# Patient Record
Sex: Female | Born: 1945 | Race: White | Hispanic: No | Marital: Single
Health system: Midwestern US, Academic
[De-identification: ages and names within clinical notes are randomized; demographics above are authoritative.]

---

## 2017-03-22 ENCOUNTER — Encounter: Admit: 2017-03-22 | Discharge: 2017-03-22 | Payer: MEDICARE

## 2017-03-22 ENCOUNTER — Encounter: Admit: 2017-03-22 | Discharge: 2017-03-23 | Payer: MEDICARE

## 2017-03-22 DIAGNOSIS — R69 Illness, unspecified: Principal | ICD-10-CM

## 2017-03-24 ENCOUNTER — Encounter: Admit: 2017-03-24 | Discharge: 2017-03-25 | Payer: MEDICARE

## 2017-03-24 DIAGNOSIS — R69 Illness, unspecified: Principal | ICD-10-CM

## 2017-05-09 ENCOUNTER — Encounter: Admit: 2017-05-09 | Discharge: 2017-05-09 | Payer: MEDICARE

## 2017-05-09 NOTE — Telephone Encounter
Navigation Intake Assessment    Patient Name:  Kathryn Holden  DOB: [birth year]  Insurance:  Medicare   Direct Referral: Dr. Sibyl Parr   Appointment Info:  Dr. Lezlie Lye 05/12/17 1000    Diagnosis & Reason for Visit:  Ovarian mass      Physician Info:  ? Referring Physician:  Jonah Blue   ??? Contact Name & Number:  [phone] F [phone]  ??? 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 Patient cannot lie flat d/t flexion contracture at the hips and knees. 03/22/17 sono d/t recent onset of urinary incontinence reports 8.4 x 6.2 x 6.4 cm cystic mass within the right adnexa. Patient with 03/24/17 CT demonstrates a right adnexal mass 8.1 x 5.6cm. 03/30/17 went the ED for blood in stool. Started PPI. 03/31/17 PCP referred on to general surgeon. Patient notes she has become more SOB with transfers recently. Plan obtain stress test/ekg and CBC in a week. 04/28/17 back to general surgeon. Hbg normal. EF 68%, stress test showed some mild reversible perfusion abnormality in basal anteroseptal and mid anteroseptal segments. Although it was a difficult study, patient is deconditioned and sits in WC. General surgery recommends Gyn Onc consult at this time.     Sister is patients DPOA and caregiver. All hx was provided below per sister.       PMH: BMI - sister verbalizes patient is 90lbs and transfers easily with 2, CVA, TIA, dystonia -d/t polio, BCC -right eye    Surgical Hx   Surgery/Year: ulcer,     Reproductive History  Menstrual Hx   LMP: late 7   Having Periods:  No   Age at first period: 30    Pregnancy Hx   Number of pregnancies: 0   Number of live births: 0   Age of first live birth:    Did you breastfeed: No    If Yes, how long?    Oral Birth Control:  No   Years:    Infertility Medication:  No   Year/Med Name:     Menopausal Hx   Age of last period:    Hormone Replacement Therapy:  No Years:         Health Maintenence  Last Pap: ~15years  Abn History of Pap: denies    Colonoscopy: 2017    Mammogram: 2008    Bone scan: denies    DPOA: denies    Living Will: denies       NEEDS Assessment:     Genetic Counseling:  Assessment:  Genetic Assessment: Not assessed at this time      Intervention:        Nutrition:  Assessment:  Current Weight (in pounds): 90  Additional Nutrition Assessment: Not assessed at this time    Intervention:       Social & Financial:  Assessment:  Social and Financial Assessment: Lodging;Transportation    Intervention:          Spiritual & Emotional:  Assessment:   Spiritual and Emotional Assessment: Reports adequate support system    Intervention:       Physical:  Assessment:  Fall Risk: Use of assistive device  Physical needs: Use of assistive device  Assistive Devices: Wheelchair    Intervention:       Communication:  Assessment:  Communication Barrier: Yes  Intervention:       Onc Fertility:   Assessment:  Onc Fertility Assessment: Not applicable    Intervention:       Additional Education:

## 2017-05-11 ENCOUNTER — Encounter: Admit: 2017-05-11 | Discharge: 2017-05-11 | Payer: MEDICARE

## 2017-05-11 DIAGNOSIS — R69 Illness, unspecified: Principal | ICD-10-CM

## 2017-05-12 ENCOUNTER — Encounter: Admit: 2017-05-12 | Discharge: 2017-05-12 | Payer: MEDICARE

## 2017-05-12 DIAGNOSIS — R1900 Intra-abdominal and pelvic swelling, mass and lump, unspecified site: Principal | ICD-10-CM

## 2017-05-12 DIAGNOSIS — Z993 Dependence on wheelchair: ICD-10-CM

## 2017-05-12 DIAGNOSIS — G893 Neoplasm related pain (acute) (chronic): ICD-10-CM

## 2017-05-12 DIAGNOSIS — M1990 Unspecified osteoarthritis, unspecified site: ICD-10-CM

## 2017-05-12 DIAGNOSIS — I10 Essential (primary) hypertension: ICD-10-CM

## 2017-05-12 DIAGNOSIS — Z8673 Personal history of transient ischemic attack (TIA), and cerebral infarction without residual deficits: ICD-10-CM

## 2017-05-12 DIAGNOSIS — M810 Age-related osteoporosis without current pathological fracture: ICD-10-CM

## 2017-05-12 DIAGNOSIS — G8220 Paraplegia, unspecified: ICD-10-CM

## 2017-05-12 DIAGNOSIS — I639 Cerebral infarction, unspecified: ICD-10-CM

## 2017-05-12 DIAGNOSIS — C4490 Unspecified malignant neoplasm of skin, unspecified: Principal | ICD-10-CM

## 2017-05-12 DIAGNOSIS — Z85828 Personal history of other malignant neoplasm of skin: ICD-10-CM

## 2017-05-12 DIAGNOSIS — IMO0002 Ulcer: ICD-10-CM

## 2017-05-12 DIAGNOSIS — N838 Other noninflammatory disorders of ovary, fallopian tube and broad ligament: ICD-10-CM

## 2017-05-12 DIAGNOSIS — A809 Acute poliomyelitis, unspecified: ICD-10-CM

## 2017-05-12 LAB — CA125: Lab: 24 U/mL — ABNORMAL HIGH (ref <35)

## 2017-05-12 NOTE — Progress Notes
Subjective     GYNECOLOGIC ONCOLOGY EVALUATION    Name:Kathryn Holden    Date: 05/12/17    Referring Physician: Jonah Blue     Primary Care Physician: Dr. Steva Ready and [facility], [place]    Chief Complaint: Pelvic mass (accompanied by her sister Kathryn Holden - DPOA and her niece)  Chief Complaint   Patient presents with   ??? Heme/Onc Care       History of Present Illness: Ms. Kathryn Holden is a 71 y.o. female with the following history. The patient has multiple severe underlying medical problems including, a history of CVA and TIAs and paraplegia from polio as a child. Received live polio vaccine. Is wheelchair bound with dystonia and contractures (severe flexures at hips and knees). Has had multiple musculoskeletal, nerve and spinal surgeries. Lives in group home. Has a job Advertising account planner. Sister is her DPOA.  Has had difficulty with increasing urinary incontinence. Workup included an abdominal ultrasound and CT.  -03/22/17:  abdominal ultrasound sono demonstrated an 8.4 x 6.2 x 6.4 cm cystic mass within the right adnexa.   -03/24/17: CT demonstrated a right adnexal mass 8.1 x 5.6cm.   -03/30/17: went the ED for blood in stool. Started on a  PPI.   -03/31/17: PCP referred on to a general surgeon. Patient noted to have become more SOB with transfers. Requires more help at group home.   -04/28/17: Follow up with general surgeon. Hbg normal. EF 68%, stress test showed some mild reversible perfusion abnormality in basal anteroseptal and mid anteroseptal segments (difficult study). Referred to Gyn Onc.      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 The patient reports increased fatigue. Has a mild URI (runny nose, cough and occasional wheezing). Denies abdominal/pelvic pain. However, her sister states that she does normally feel pain because of nerve damage. She denies fever/chills, change in bowel or bladder habits (baseline constipation and urinary incontinence), and has blood in stool. No vaginal bleeding or discharge.     OB/GYN History:  Menstrual Hx               LMP: late 63               Having Periods:  No               Age at first period: 75  ???  Pregnancy Hx               Number of pregnancies: 0               Number of live births: 0               Age of first live birth:                Did you breastfeed: No                            If Yes, how long?                Oral Birth Control:  No   Years:                Infertility Medication:  No               Year/Med Name:   ???  Menopausal Hx  Age of last period:                Hormone Replacement Therapy:  No               Years:   ???  Health Maintenance:  Last Pap: ~15years  Abn History of Pap: denies  Colonoscopy: 2017  Mammogram: 2008    Past Medical History:  Past Medical History:   Diagnosis Date   ??? Arthritis    ??? Cancer of skin     under right eye   ??? Hypertension    ??? Osteoporosis    ??? Polio    ??? Stroke [facility])    ??? Ulcer        Past Surgical History:  Past Surgical History:   Procedure Laterality Date   ??? HX SKIN RESECTION Right 2012    basal cell carcinoma form right periorbital region   ??? SURGERY  2017    ulcer ablation   ??? NERVE SURGERY      nervectomy    ??? OTHER SURGICAL HISTORY      Musculoskeletal surgery       Medications:    Current Outpatient Medications:   ???  acetaminophen (TYLENOL) 325 mg tablet, Take 325 mg by mouth every 4 hours as needed for Pain., Disp: , Rfl:   ???  albuterol 0.083% (PROVENTIL; VENTOLIN) 2.5 mg /3 mL (0.083 %) nebulizer solution, USE 1 VIAL IN NEBULIZER 3 TIIMES DAILY AS NEEDED FOR SHORTNESS OF BREATH/WHEEZING, Disp: , Rfl: 5  ???  ascorbic acid (VITAMIN C) 500 mg tablet, Take 500 mg by mouth daily., Disp: , Rfl:   ???  cetirizine (ZYRTEC) 10 mg tablet, Take 10 mg by mouth at bedtime daily., Disp: , Rfl: 2 ???  cholecalciferol (VITAMIN D-3) 1,000 units tablet, Take 1,000 Units by mouth daily., Disp: , Rfl:   ???  diazePAM (VALIUM) 5 mg tablet, Take 5 mg by mouth three times daily., Disp: , Rfl: 2  ???  docusate (COLACE) 100 mg capsule, Take 100 mg by mouth twice daily., Disp: , Rfl:   ???  ferrous sulfate (FEOSOL, FEROSUL) 325 mg (65 mg iron) tablet, Take 325 mg by mouth twice daily., Disp: , Rfl: 3  ???  meloxicam (MOBIC) 7.5 mg tablet, Take 7.5 mg by mouth twice daily., Disp: , Rfl: 6  ???  metoprolol XL (TOPROL XL) 25 mg extended release tablet, TAKE 1 TABLET BY MOUTH DAILY IN THE MORNING AND 1/2 TABLET IN THE EVENING, Disp: , Rfl: 3  ???  MULTIVITAMIN PO, Take  by mouth., Disp: , Rfl:   ???  MYRBETRIQ 50 mg tablet, Take 50 mg by mouth daily., Disp: , Rfl: 5  ???  pantoprazole DR (PROTONIX) 40 mg tablet, Take 40 mg by mouth daily., Disp: , Rfl: 2    Allergies:  Allergies   Allergen Reactions   ??? Codeine UNKNOWN   ??? Penicillins UNKNOWN   ??? Sulfa (Sulfonamide Antibiotics) UNKNOWN   ??? Tegretol [Carbamazepine] UNKNOWN       Social History:  Social History     Socioeconomic History   ??? Marital status: Single     Spouse name: Not on file   ??? Number of children: Not on file   ??? Years of education: Not on file   ??? Highest education level: Not on file   Social Needs   ??? Financial resource strain: Not on file   ??? Food insecurity - worry: Not on file   ???  Food insecurity - inability: Not on file   ??? Transportation needs - medical: Not on file   ??? Transportation needs - non-medical: Not on file   Occupational History   ??? Not on file   Tobacco Use   ??? Smoking status: Never Smoker   ??? Smokeless tobacco: Never Used   Substance and Sexual Activity   ??? Alcohol use: No     Frequency: Never   ??? Drug use: No   ??? Sexual activity: Not on file   Other Topics Concern   ??? Not on file   Social History Narrative   ??? Not on file       Family History:  Family History   Problem Relation Age of Onset   ??? Hypertension Mother    ??? Stroke Mother ??? Depression Mother    ??? Cancer-Prostate Father    ??? Hypertension Father    ??? High Cholesterol Sister    ??? Cancer Maternal Aunt    ??? Cancer Maternal Uncle    ??? Unknown to Patient Paternal Aunt    ??? Depression Paternal Uncle    ??? None Reported Maternal Grandmother    ??? Cancer Paternal Grandmother    ??? Cancer Paternal Grandfather      REVIEW OF SYSTEMS:             CONSTITUTIONAL: Negative unless stated in HPI  EYES: Negative unless stated in HPI  ENT: Negative unless stated in HPI  RESPIRATORY: Negative unless stated in HPI  CARDIOVASCULAR: Negative unless stated in HPI  GI: Negative unless stated in HPI  GU: Negative unless stated in HPI  MUSCULO-SKELETAL: Negative unless stated in HPI  SKIN: Negative unless stated in HPI  ENDOCRINE: Negative unless stated in HPI  HEMATOLOGIC: Negative unless stated in HPI    ECOG 3    Physical Exam:  BP 143/61 (BP Source: Arm, Left Upper, Patient Position: Sitting)  - Pulse 70  - Temp 36.6 ???C (97.8 ???F) (Oral)  - Resp 16  - Ht 147.3 cm (58) Comment: per patient-unable to stand - Wt 44.2 kg (97 lb 6.4 oz)  - SpO2 93%  - BMI 20.36 kg/m???   GENERAL APPEARANCE: Appears healthy.  Alert; in no acute distress.  Pleasant.  HEENT: Ecchymoses on chin and forehead. Appropriately tender.   NECK: Neck brace in place. No tenderness. No adenopathy.    LUNGS: Chest symmetrical. Good diaphragmatic excursion. Lungs clear; normal breath sounds.  CARDIOVASCULAR:  RRR. Heart sounds normal.      ABDOMEN: Abdomen soft, non-tender.  No masses, organomegaly, or hernia. No clinical evidence of ascites.    PELVIC:   External genitalia, anus, perineum, urethral meatus, urethra, bladder and vagina atrophic. Pediatric speculum used with patient on her side (unable to extend hip for pelvic stirrups). Able to visualize narrow introitus and atrophic cervix without lesions. Bimanual and rectovaginal exam revealed a small and mobile uterus. Ballotable mass palpated, relatively mobile, not fixed to the pelvic sidewalls or rectovaginal septum. No gross blood in stool.  Exam chaperoned by nurse.  EXTREMITIES: Extremities normal. No joint deformities, edema, or skin discoloration. Station and gait normal.  SKIN: Skin color, texture, turgor normal. No rashes or lesions.  LYMPH NODES: No palpable supraclavicular or inguinal lymph nodes.    Counseling: I reviewed the patient's history, physical exam, and outside imaging in detail with the patient and her family.  Findings are consistent with a benign ovarian cyst.  Because the patient is completely asymptomatic and has  multiple severe underlying medical problems, I recommend conservative management.  Reviewed the indications for intervention, which include development of abdominal/pelvic pain, significant change in bowel or bladder habits, or development of vaginal bleeding.  I do not believe that the patient can tolerate a transvaginal ultrasound and the abdominal ultrasound imaging was not as clear as the CT imaging because of the patient's positioning on the exam table.  Therefore, I recommend repeat CT imaging in 3 months.   The patient expressed verbal understanding of the basics of the conversation. The patient's sister's questions and concerns were addressed to the rest of my ability and to her satisfaction.  On behalf of the patient, Kathryn Holden (sister) expressed verbal understanding and agreed to the plan.      ASSESSMENT/PLAN:  ELPHA RYON is a 71 y.o. female with a pelvic mass, likely benign.  1. Pelvic mass. Conservative management.  -CA125 today and repeat in 3 months.  -CT abdomen/pelvis in 3 months.   2. Multiple underlying medical problems. High risk for surgical and anesthesia complications.     Benancio Deeds, MD, [facility]  Gynecologic Oncology  Pager: [phone]

## 2017-05-13 ENCOUNTER — Encounter: Admit: 2017-05-13 | Discharge: 2017-05-13 | Payer: MEDICARE

## 2017-05-13 NOTE — Telephone Encounter
Spoke to [place] and let her know that CA125 was normal which was a good sign that probability of cancer was low. Let her know that CT order was faxed to [facility] this morning and if she did not hear anything in a few days she should call to schedule. She verbalized understanding.

## 2017-07-18 ENCOUNTER — Encounter: Admit: 2017-07-18 | Discharge: 2017-07-18 | Payer: MEDICARE

## 2017-07-18 DIAGNOSIS — R1900 Intra-abdominal and pelvic swelling, mass and lump, unspecified site: Principal | ICD-10-CM

## 2017-07-19 ENCOUNTER — Encounter: Admit: 2017-07-19 | Discharge: 2017-07-19 | Payer: MEDICARE

## 2017-07-21 ENCOUNTER — Encounter: Admit: 2017-07-21 | Discharge: 2017-07-21 | Payer: MEDICARE

## 2017-08-11 ENCOUNTER — Encounter: Admit: 2017-08-11 | Discharge: 2017-08-11 | Payer: MEDICARE

## 2017-08-18 ENCOUNTER — Encounter: Admit: 2017-08-18 | Discharge: 2017-08-18 | Payer: MEDICARE

## 2017-08-18 DIAGNOSIS — I10 Essential (primary) hypertension: ICD-10-CM

## 2017-08-18 DIAGNOSIS — A809 Acute poliomyelitis, unspecified: ICD-10-CM

## 2017-08-18 DIAGNOSIS — C4490 Unspecified malignant neoplasm of skin, unspecified: Principal | ICD-10-CM

## 2017-08-18 DIAGNOSIS — M810 Age-related osteoporosis without current pathological fracture: ICD-10-CM

## 2017-08-18 DIAGNOSIS — IMO0002 Ulcer: ICD-10-CM

## 2017-08-18 DIAGNOSIS — R1900 Intra-abdominal and pelvic swelling, mass and lump, unspecified site: Principal | ICD-10-CM

## 2017-08-18 DIAGNOSIS — M1990 Unspecified osteoarthritis, unspecified site: ICD-10-CM

## 2017-08-18 DIAGNOSIS — I639 Cerebral infarction, unspecified: ICD-10-CM

## 2017-08-19 ENCOUNTER — Encounter: Admit: 2017-08-19 | Discharge: 2017-08-19 | Payer: MEDICARE

## 2018-02-21 ENCOUNTER — Encounter: Admit: 2018-02-21 | Discharge: 2018-02-21 | Payer: MEDICARE

## 2018-02-22 ENCOUNTER — Encounter: Admit: 2018-02-22 | Discharge: 2018-02-22 | Payer: MEDICARE

## 2018-02-22 DIAGNOSIS — R1900 Intra-abdominal and pelvic swelling, mass and lump, unspecified site: Principal | ICD-10-CM

## 2018-02-24 ENCOUNTER — Encounter: Admit: 2018-02-24 | Discharge: 2018-02-24 | Payer: MEDICARE

## 2018-05-30 IMAGING — CR CHEST
2 series · 2 of 2 positions shown · non-contrast
Comparison: none

[chest ap grid]
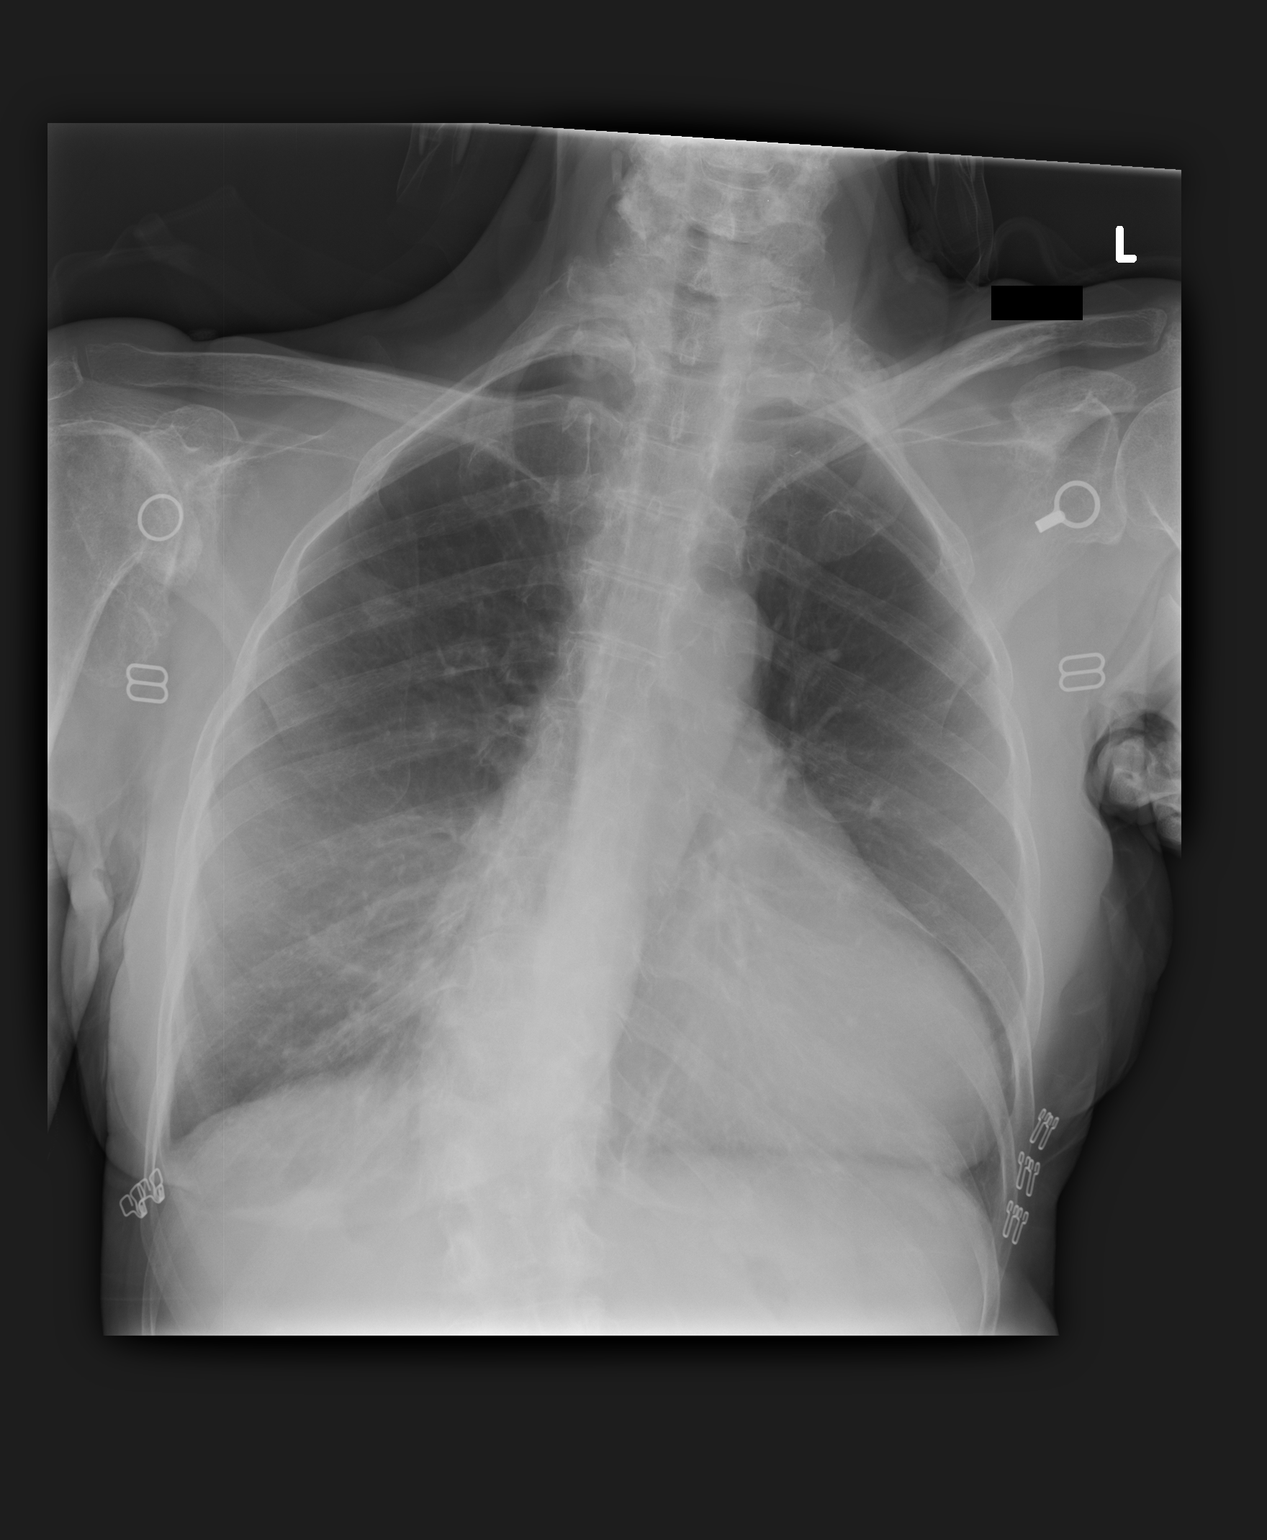

[chest lat]
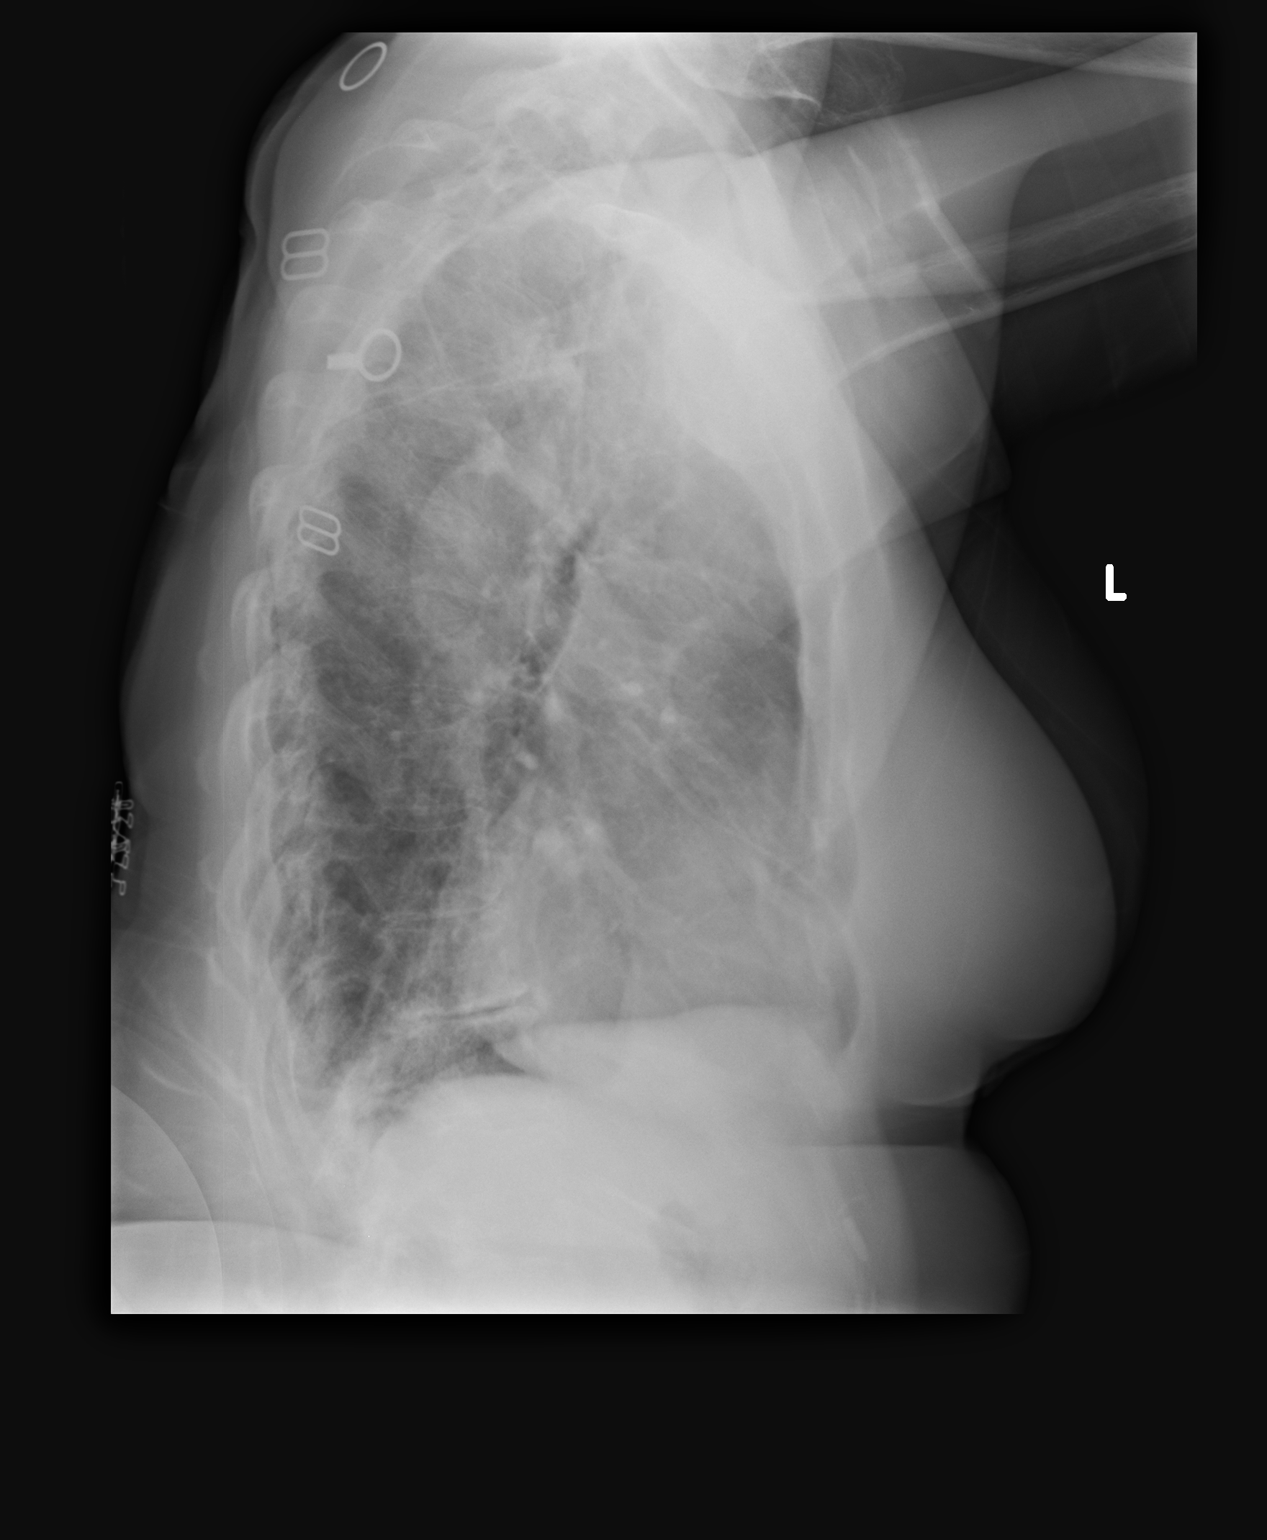

[2 of 2 positions shown; findings below may reference images not displayed]

DIAGNOSTIC STUDIES

EXAM

TWO VIEW CHEST

INDICATION

persistent cough, fever
COUGH, FEVER.

TECHNIQUE

PA and lateral chest views

COMPARISONS

06/04/2017, 03/30/2017, and 03/17/2017

FINDINGS

The patient is rotated. The aorta is elongated. The cardiac silhouette is enlarged. Increased
density is present in both lung bases, left slightly greater than right. Findings are compatible
with basilar infiltrate. No pleural effusions are present. Bony demineralization and degenerative
changes are present. Scoliosis which may be in part positional is present.

IMPRESSION

Bibasilar infiltrates, left greater than right. Enlargement cardiac silhouette.

## 2018-06-03 IMAGING — CR ABDOMEN
1 series · 1 of 1 positions shown · non-contrast
Comparison: none

[abdomen port]
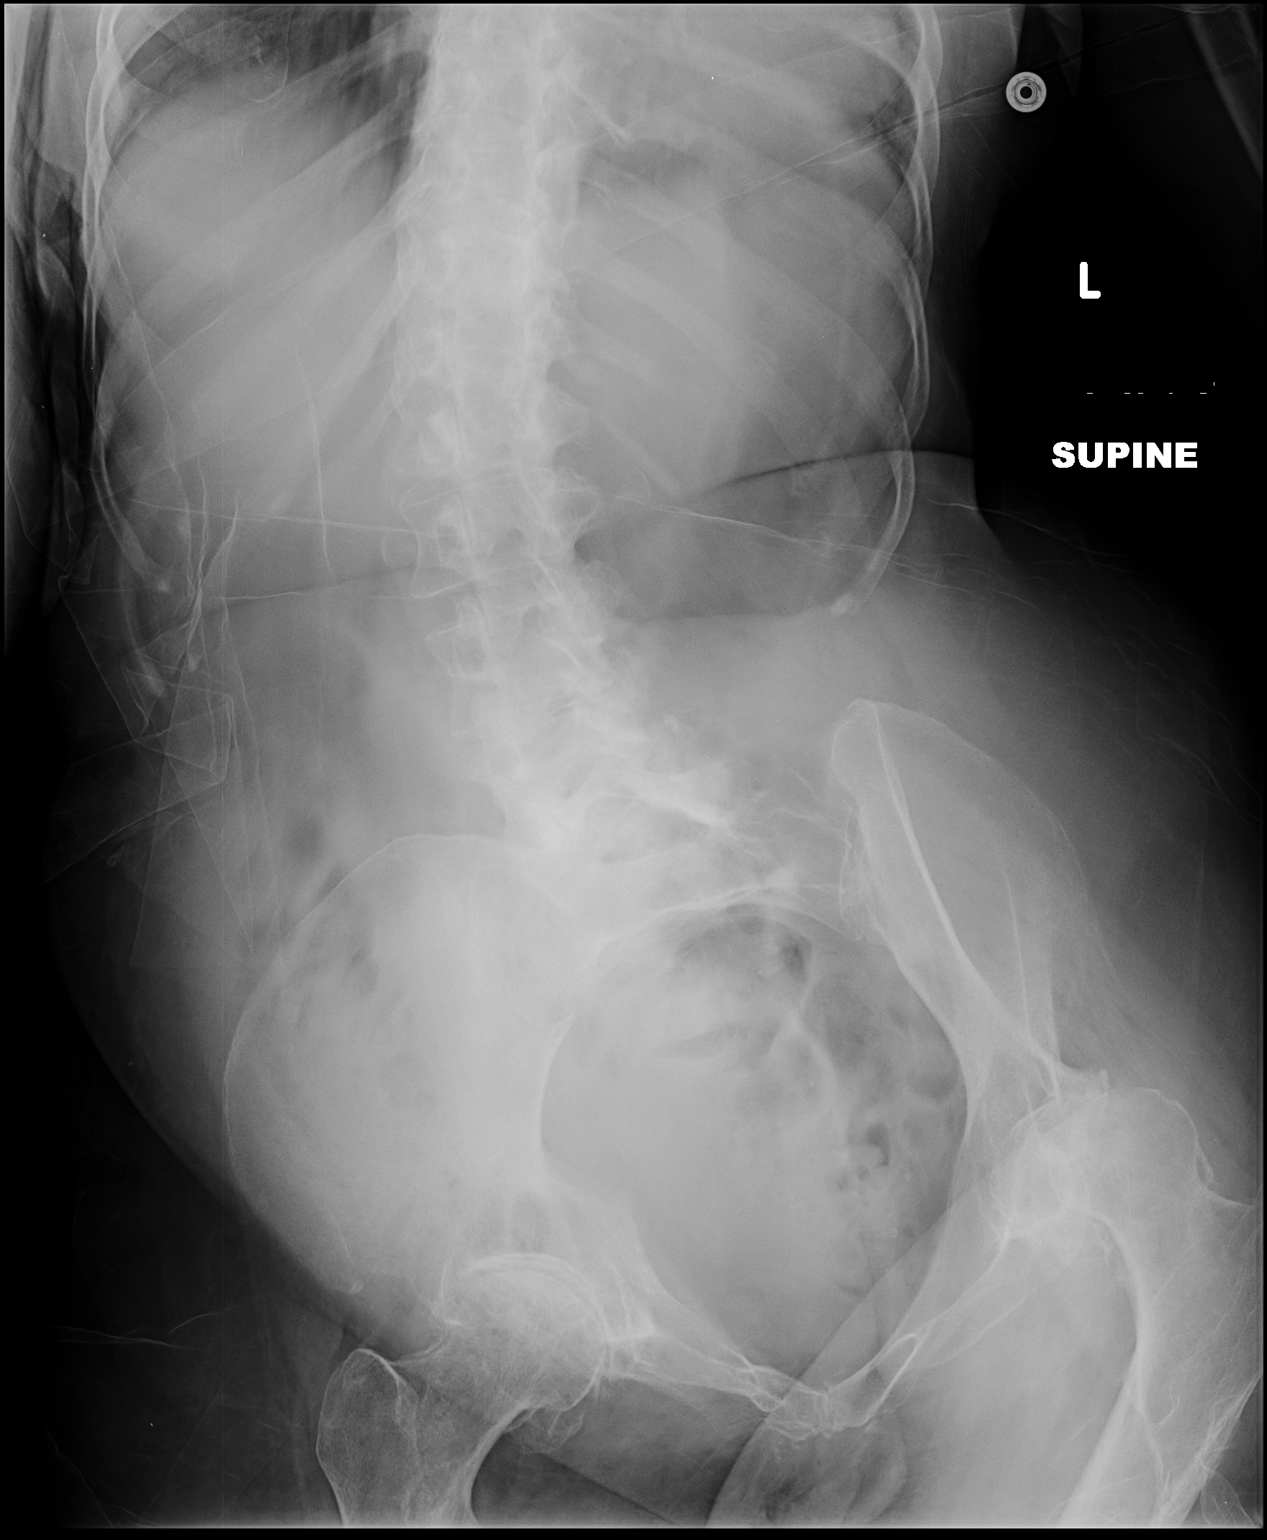

[1 of 1 positions shown; findings below may reference images not displayed]

EXAM
KUB, RADIOLOGIC EXAMINATION, ABDOMEN; SINGLE ANTEROPOSTERIOR VIEW CPT 17444

INDICATION
coughing and emesis
PT IS EXPERIENCING COUGH AND EMESIS. PT HAD DIFFICULTY IN BEING COMPLETELY
SUPINE FOR AP ABDOMEN VIEW. SB

TECHNIQUE
1 view of the abdomen was acquired.

COMPARISONS
None

FINDINGS
Single supine view of the abdomen shows no high-grade bowel obstruction or free air. Bowel gas
pattern is unremarkable. There is gas and stool within the rectal vault. No abnormal
calcifications. Lumbar spondylosis is noted. Degenerative changes of the hip joints are noted as
well.

IMPRESSION
No high-grade bowel obstruction or free air.

## 2018-06-05 IMAGING — CR CHEST
1 series · 1 of 1 positions shown · non-contrast
Comparison: none

[chest port x-wise]
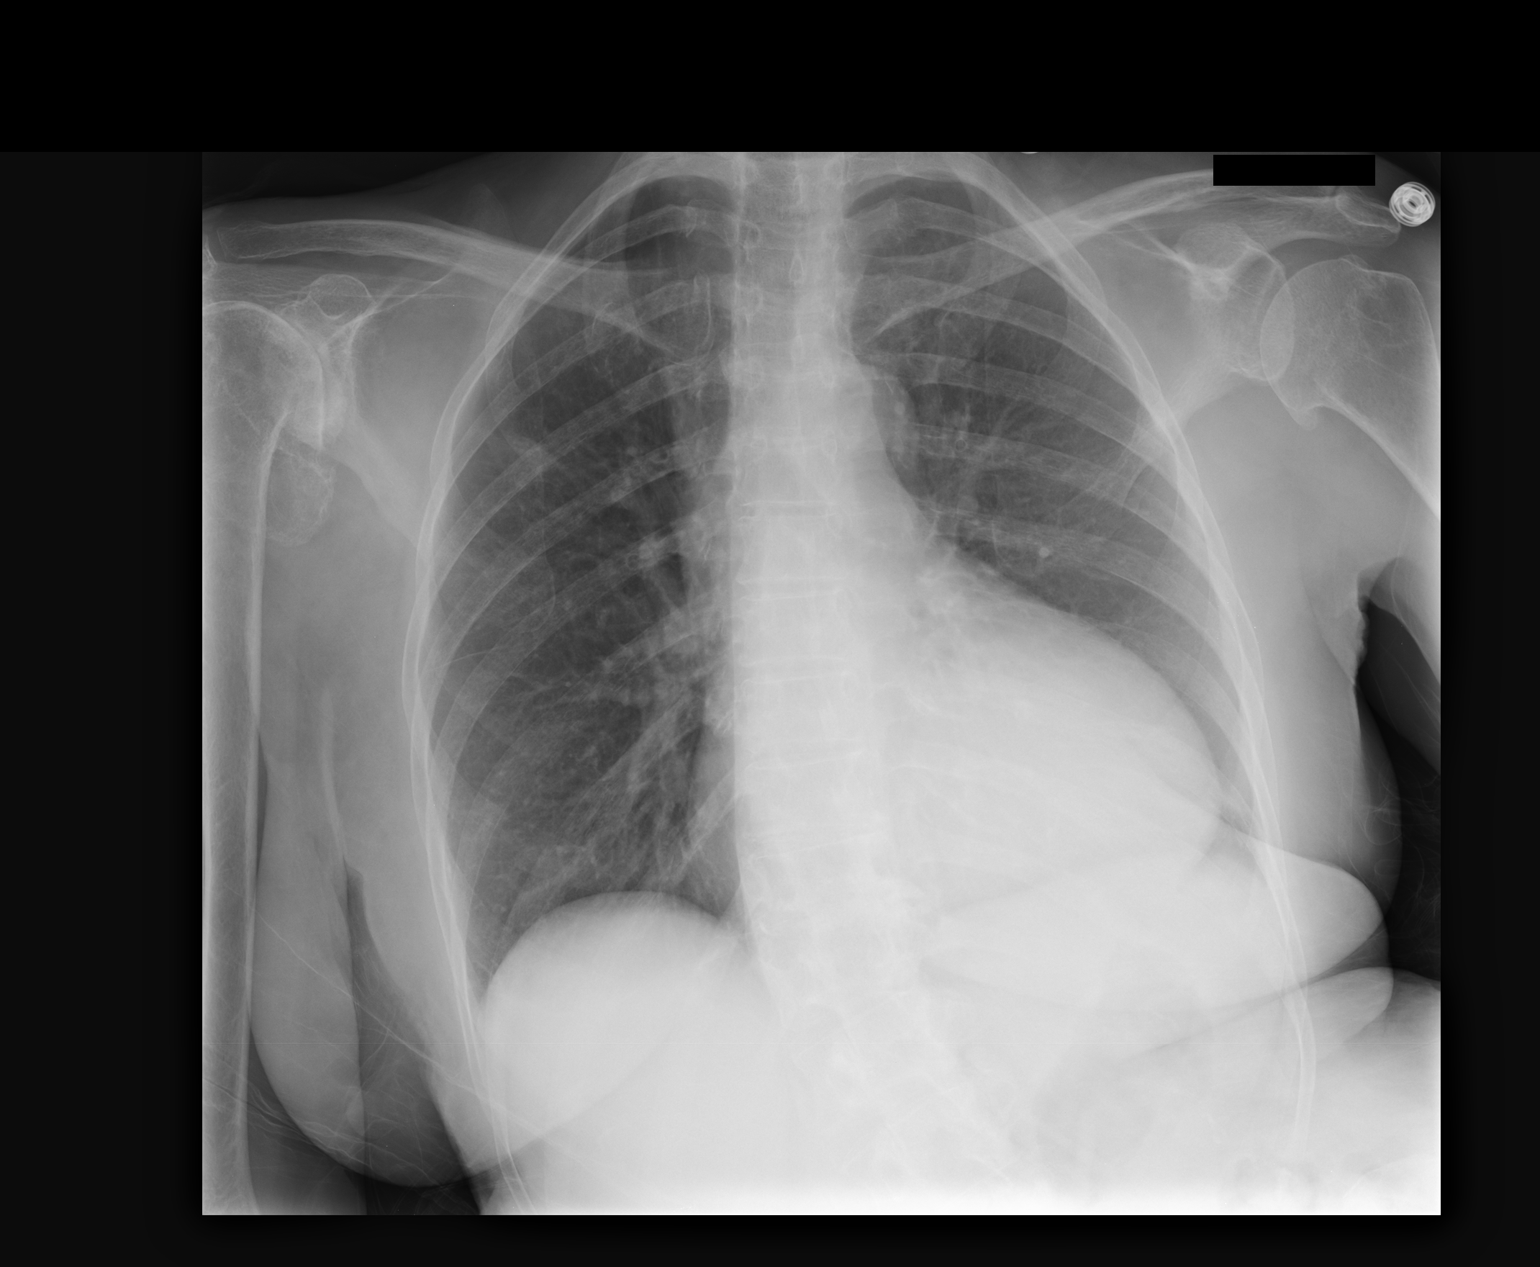

[1 of 1 positions shown; findings below may reference images not displayed]

EXAM
RADIOLOGICAL EXAMINATION, CHEST; SINGLE VIEW, FRONTAL CPT 21515

INDICATION
follow up pneumonia and cough
F/U PN. AND COUGH.

TECHNIQUE
1 view of the chest was acquired.

COMPARISONS
Previous examination dated 06/11/2017.

FINDINGS
The cardiac silhouette is mildly enlarged. On the current exam there is better delineation of the
left diaphragmatic border. Pulmonary vasculature is normal in caliber.

IMPRESSION
Better delineation of the left diaphragmatic border likely reflective of improvement in the left
lower lobe infiltrate. Lateral view of the chest would be of additional diagnostic value.

## 2019-09-17 ENCOUNTER — Encounter: Admit: 2019-09-17 | Discharge: 2019-09-17 | Payer: MEDICARE

## 2019-09-17 NOTE — Telephone Encounter
Called patient's sister, Corrie Dandy, and scheduled appt with Dr. Roderic Scarce 5/10. Mary advised patient has speech impairment since stroke, I advised not a problem for [facility] to be at appt with patient.

## 2019-09-24 ENCOUNTER — Encounter: Admit: 2019-09-24 | Discharge: 2019-09-24 | Payer: MEDICARE

## 2019-09-24 DIAGNOSIS — A809 Acute poliomyelitis, unspecified: Secondary | ICD-10-CM

## 2019-09-24 DIAGNOSIS — IMO0002 Ulcer: Secondary | ICD-10-CM

## 2019-09-24 DIAGNOSIS — I639 Cerebral infarction, unspecified: Secondary | ICD-10-CM

## 2019-09-24 DIAGNOSIS — M1990 Unspecified osteoarthritis, unspecified site: Secondary | ICD-10-CM

## 2019-09-24 DIAGNOSIS — I10 Essential (primary) hypertension: Secondary | ICD-10-CM

## 2019-09-24 DIAGNOSIS — C4490 Unspecified malignant neoplasm of skin, unspecified: Secondary | ICD-10-CM

## 2019-09-24 DIAGNOSIS — M810 Age-related osteoporosis without current pathological fracture: Secondary | ICD-10-CM

## 2019-10-05 ENCOUNTER — Encounter: Admit: 2019-10-05 | Discharge: 2019-10-05 | Payer: MEDICARE

## 2019-10-05 NOTE — Telephone Encounter
Completed office note faxed to [facility] per their request. Fax confirmation received.

## 2020-01-10 ENCOUNTER — Encounter: Admit: 2020-01-10 | Discharge: 2020-01-10 | Payer: MEDICARE

## 2020-09-14 DEATH — deceased
